# Patient Record
Sex: Female | Born: 1960 | Race: White | Hispanic: No | Marital: Married | Smoking: Never smoker
Health system: Southern US, Community
[De-identification: ages and names within clinical notes are randomized; demographics above are authoritative.]

## PROBLEM LIST (undated history)

## (undated) DIAGNOSIS — G629 Polyneuropathy, unspecified: Secondary | ICD-10-CM

## (undated) DIAGNOSIS — G500 Trigeminal neuralgia: Secondary | ICD-10-CM

## (undated) DIAGNOSIS — E785 Hyperlipidemia, unspecified: Secondary | ICD-10-CM

## (undated) HISTORY — PX: OTHER SURGICAL HISTORY: SHX169

## (undated) HISTORY — DX: Trigeminal neuralgia: G50.0

## (undated) HISTORY — PX: COLONOSCOPY: SHX174

## (undated) HISTORY — DX: Hyperlipidemia, unspecified: E78.5

## (undated) HISTORY — DX: Polyneuropathy, unspecified: G62.9

---

## 2007-05-27 ENCOUNTER — Ambulatory Visit: Payer: Self-pay | Admitting: Cardiology

## 2016-08-14 DIAGNOSIS — G500 Trigeminal neuralgia: Secondary | ICD-10-CM | POA: Insufficient documentation

## 2016-11-09 HISTORY — PX: OTHER SURGICAL HISTORY: SHX169

## 2018-08-08 ENCOUNTER — Other Ambulatory Visit (HOSPITAL_COMMUNITY): Payer: Self-pay | Admitting: Family Medicine

## 2018-08-08 ENCOUNTER — Ambulatory Visit (HOSPITAL_COMMUNITY)
Admission: RE | Admit: 2018-08-08 | Discharge: 2018-08-08 | Disposition: A | Discharge status: Home / Self Care | Payer: PRIVATE HEALTH INSURANCE | Source: Ambulatory Visit | Attending: Family Medicine | Admitting: Family Medicine

## 2018-08-08 DIAGNOSIS — M79605 Pain in left leg: Secondary | ICD-10-CM

## 2018-09-18 ENCOUNTER — Ambulatory Visit: Payer: PRIVATE HEALTH INSURANCE | Admitting: Orthopedic Surgery

## 2018-09-18 ENCOUNTER — Encounter: Payer: Self-pay | Admitting: Orthopedic Surgery

## 2018-09-18 VITALS — BP 110/72 | HR 73 | Ht 60.0 in | Wt 126.0 lb

## 2018-09-18 DIAGNOSIS — M1710 Unilateral primary osteoarthritis, unspecified knee: Secondary | ICD-10-CM

## 2018-09-18 DIAGNOSIS — M5432 Sciatica, left side: Secondary | ICD-10-CM | POA: Diagnosis not present

## 2018-09-18 NOTE — Progress Notes (Signed)
  NEW PATIENT OFFICE VISIT  Chief Complaint  Patient presents with  . Knee Pain    had left knee pain/ left leg pain but is resolved now     57 year old female referred [facility] by [facility].  The patient reports having had an episode of posterior knee pain with some mild vague symptoms of radiation into the hip complains of some intermittent mild back pain nothing too severe however in the course of her work-up her x-ray of the knee showed she had mild arthritis.  She was having some difficulty walking at the time she had the posterior knee pain and hence she was eventually referred here for further evaluation  She currently has no knee pain front or back of the left knee although she did fall and landed on her right knee has an abrasion with some pain over the abrasion of the right side  Her pain in the left knee was approximately 1-1/2 months ago on her about October 31.   Review of Systems  Constitutional: Negative for malaise/fatigue.  Musculoskeletal: Positive for back pain and joint pain.  Skin:       Abrasion right knee  Neurological: Negative for tingling, sensory change and speech change.     History reviewed. No pertinent past medical history.  History reviewed. No pertinent surgical history.  Family History  Problem Relation Age of Onset  . Heart disease Maternal Grandmother   . Heart disease Maternal Grandfather   . Heart disease Paternal Grandmother    Social History   Tobacco Use  . Smoking status: Never Smoker  . Smokeless tobacco: Never Used  Substance Use Topics  . Alcohol use: Not on file  . Drug use: Not on file    Not on File  Current Meds  Medication Sig  . gabapentin (NEURONTIN) 300 MG capsule Take by mouth.    BP 110/72   Pulse 73   Ht 5' (1.524 m)   Wt 126 lb (57.2 kg)   BMI 24.61 kg/m   Physical Exam  Constitutional: She is oriented to person, place, and time. She appears well-developed and well-nourished.   Neurological: She is alert and oriented to person, place, and time.  Psychiatric: She has a normal mood and affect. Judgment normal.  Vitals reviewed.   Ortho Exam  Right knee skin has an abrasion over the right anterior portion of the knee which is tender she has no effusion she has full range of motion there is no instability muscle tone is normal she is neurovascularly intact  On the left knee is no joint effusion there is some tenderness very mild over the medial joint line instability none detected muscle strength and tone normal no meniscal signs  Looks great neurovascular exam is normal  She does have some lower back tenderness especially L4-S1 and also left paracentral    MEDICAL DECISION SECTION  [facility] have a report that says very mild medial compartment degenerative changes questionable very tiny loose body cannot be excluded   Encounter Diagnoses  Name Primary?  . Sciatica of left side Yes  . Primary localized osteoarthritis of knee     PLAN: (Rx., injectx, surgery, frx, mri/ct) She is advised on the lifting mechanics told to follow-up with us if any of the symptoms and back her knee becomes significant  No orders of the defined types were placed in this encounter.   Fuller CanadaStanley Kameko Hukill, MD  09/18/2018 2:46 PM

## 2019-12-07 ENCOUNTER — Ambulatory Visit: Payer: PRIVATE HEALTH INSURANCE | Attending: Internal Medicine

## 2019-12-07 DIAGNOSIS — Z23 Encounter for immunization: Secondary | ICD-10-CM

## 2019-12-07 NOTE — Progress Notes (Signed)
   Covid-19 Vaccination Clinic  Name:  Britta Louth    MRN: [id number] DOB: [birth year]  12/07/2019  Ms. Lampson was observed post Covid-19 immunization for 15 minutes without incidence. She was provided with Vaccine Information Sheet and instruction to access the V-Safe system.   Ms. Parma was instructed to call 911 with any severe reactions post vaccine: Marland Kitchen Difficulty breathing  . Swelling of your face and throat  . A fast heartbeat  . A bad rash all over your body  . Dizziness and weakness    Immunizations Administered    Name Date Dose VIS Date Route   Moderna COVID-19 Vaccine 12/07/2019 12:00 PM 0.5 mL 09/09/2019 Intramuscular   Manufacturer: Moderna   Lot: [id number]   NDC: 33435-686-16

## 2020-01-10 ENCOUNTER — Ambulatory Visit: Payer: PRIVATE HEALTH INSURANCE | Attending: Internal Medicine

## 2020-01-10 DIAGNOSIS — Z23 Encounter for immunization: Secondary | ICD-10-CM

## 2020-01-10 NOTE — Progress Notes (Signed)
   Covid-19 Vaccination Clinic  Name:  Crystallynn Noorani    MRN: [id number] DOB: 28-Jul-1961  01/10/2020  Ms. Zawistowski was observed post Covid-19 immunization for 15 minutes without incident. She was provided with Vaccine Information Sheet and instruction to access the V-Safe system.   Ms. Bittman was instructed to call 911 with any severe reactions post vaccine: Marland Kitchen Difficulty breathing  . Swelling of face and throat  . A fast heartbeat  . A bad rash all over body  . Dizziness and weakness   Immunizations Administered    Name Date Dose VIS Date Route   Moderna COVID-19 Vaccine 01/10/2020 11:24 AM 0.5 mL 09/09/2019 Intramuscular   Manufacturer: Moderna   Lot: [id number]   NDC: 12244-975-30

## 2020-04-21 ENCOUNTER — Ambulatory Visit: Payer: Self-pay

## 2020-04-21 ENCOUNTER — Ambulatory Visit (INDEPENDENT_AMBULATORY_CARE_PROVIDER_SITE_OTHER): Payer: BC Managed Care – PPO | Admitting: Orthopaedic Surgery

## 2020-04-21 ENCOUNTER — Other Ambulatory Visit: Payer: Self-pay

## 2020-04-21 ENCOUNTER — Encounter: Payer: Self-pay | Admitting: Orthopaedic Surgery

## 2020-04-21 VITALS — Ht 60.0 in | Wt 123.0 lb

## 2020-04-21 DIAGNOSIS — G8929 Other chronic pain: Secondary | ICD-10-CM | POA: Diagnosis not present

## 2020-04-21 DIAGNOSIS — M25512 Pain in left shoulder: Secondary | ICD-10-CM | POA: Diagnosis not present

## 2020-04-21 NOTE — Progress Notes (Signed)
Office Visit Note   Patient: Cassie Brown           Date of Birth: July 18, 1961           MRN: [id number] Visit Date: 04/21/2020              Requested by: Sasser, Clarene Critchley, MD [address] [place],  [place] [address] PCP: Estanislado Pandy, MD   Assessment & Plan: Visit Diagnoses:  1. Chronic left shoulder pain     Plan: Monica right shoulder pain for the past 3 months without history of injury or trauma.  Had cortisone injection by Dr. Neita Carp with only 2 weeks relief.  Has positive impingement testing and positive Speed sign.  I am concerned she may have a rotator cuff tear or some pathology of the biceps tendon and will order an MRI scan  Follow-Up Instructions: Return After MRI scan left shoulder.   Orders:  Orders Placed This Encounter  Procedures  . XR Shoulder Left  . MR Shoulder Left w/o contrast   No orders of the defined types were placed in this encounter.     Procedures: No procedures performed   Clinical Data: No additional findings.   Subjective: Chief Complaint  Patient presents with  . Left Shoulder - Pain  Patient presents today for left shoulder pain. She has been hurting for 3 months. No known injury. She states that her pain is all throughout her shoulder and into her proximal arm. She has difficulty sleeping at night and getting comfortable. No numbness or tingling. Limited range of motion and cannot reach behind her. She does not take anything for pain. She saw her PCP Dr.Sasser about two months ago and received a cortisone injection, but states that it only helped for two weeks. No previous shoulder surgery. She is right hand dominant.   HPI  Review of Systems   Objective: Vital Signs: Ht 5' (1.524 m)   Wt 123 lb (55.8 kg)   BMI 24.02 kg/m   Physical Exam Constitutional:      Appearance: She is well-developed.  Eyes:     Pupils: Pupils are equal, round, and reactive to light.  Pulmonary:     Effort: Pulmonary effort is normal.  Skin:     General: Skin is warm and dry.  Neurological:     Mental Status: She is alert and oriented to person, place, and time.  Psychiatric:        Behavior: Behavior normal.     Ortho Exam left upper extremity nondominant.  Mildly positive impingement and empty can testing.  More prominent Speed sign.  Able to place left arm fully overhead and flexion with mild discomfort.  Has limited internal rotation related to pain.  There is some pain over the biceps tendon in the anterior subacromial region.  Skin intact.  Motor and sensory exam intact.  Good strength.  No crepitation no pain with range of motion of cervical spine  Specialty Comments:  No specialty comments available.  Imaging: XR Shoulder Left  Result Date: 04/21/2020 Films of the left shoulder were obtained in several projections.  The humeral head is centered at the glenoid.  Normal space between the humeral head and the acromion.  Type II acromium.  No ectopic calcification.  Mild degenerative changes at the acromioclavicular joint    PMFS History: Patient Active Problem List   Diagnosis Date Noted  . Pain in left shoulder 04/21/2020  . Trigeminal neuralgia of left side of face  08/14/2016   History reviewed. No pertinent past medical history.  Family History  Problem Relation Age of Onset  . Heart disease Maternal Grandmother   . Heart disease Maternal Grandfather   . Heart disease Paternal Grandmother     History reviewed. No pertinent surgical history. Social History   Occupational History  . Not on file  Tobacco Use  . Smoking status: Never Smoker  . Smokeless tobacco: Never Used  Substance and Sexual Activity  . Alcohol use: Not on file  . Drug use: Not on file  . Sexual activity: Not on file

## 2020-05-12 ENCOUNTER — Ambulatory Visit (HOSPITAL_COMMUNITY): Payer: BC Managed Care – PPO

## 2020-06-10 ENCOUNTER — Ambulatory Visit (HOSPITAL_COMMUNITY)
Admission: RE | Admit: 2020-06-10 | Discharge: 2020-06-10 | Disposition: A | Discharge status: Home / Self Care | Payer: BC Managed Care – PPO | Source: Ambulatory Visit | Attending: Orthopaedic Surgery | Admitting: Orthopaedic Surgery

## 2020-06-10 ENCOUNTER — Other Ambulatory Visit: Payer: Self-pay

## 2020-06-10 DIAGNOSIS — M25512 Pain in left shoulder: Secondary | ICD-10-CM | POA: Insufficient documentation

## 2020-06-10 DIAGNOSIS — G8929 Other chronic pain: Secondary | ICD-10-CM | POA: Insufficient documentation

## 2020-06-30 ENCOUNTER — Encounter: Payer: Self-pay | Admitting: Orthopedic Surgery

## 2020-06-30 ENCOUNTER — Ambulatory Visit (INDEPENDENT_AMBULATORY_CARE_PROVIDER_SITE_OTHER): Payer: BC Managed Care – PPO | Admitting: Orthopedic Surgery

## 2020-06-30 ENCOUNTER — Other Ambulatory Visit: Payer: Self-pay

## 2020-06-30 DIAGNOSIS — M7582 Other shoulder lesions, left shoulder: Secondary | ICD-10-CM | POA: Diagnosis not present

## 2020-06-30 DIAGNOSIS — M7552 Bursitis of left shoulder: Secondary | ICD-10-CM | POA: Insufficient documentation

## 2020-06-30 MED ORDER — LIDOCAINE HCL 2 % IJ SOLN
2.0000 mL | INTRAMUSCULAR | Status: AC | PRN
  Administered 2020-06-30: 2 mL

## 2020-06-30 MED ORDER — METHYLPREDNISOLONE ACETATE 40 MG/ML IJ SUSP
40.0000 mg | INTRAMUSCULAR | Status: AC | PRN
  Administered 2020-06-30: 40 mg via INTRA_ARTICULAR

## 2020-06-30 MED ORDER — BUPIVACAINE HCL 0.25 % IJ SOLN
0.6600 mL | INTRAMUSCULAR | Status: AC | PRN
  Administered 2020-06-30: .66 mL via INTRA_ARTICULAR

## 2020-06-30 NOTE — Progress Notes (Signed)
Office Visit Note   Patient: Cassie Brown           Date of Birth: [birth year]           MRN: [id number] Visit Date: 06/30/2020              Requested by: Sasser, Clarene Critchley, MD [address] [place],  [place] [address] PCP: Estanislado Pandy, MD   Assessment & Plan: Visit Diagnoses:  1. Bursitis of left shoulder   2. Rotator cuff tendonitis, left     Plan:  #1: At this time I have injected the subacromial space of the left shoulder.  She actually has had improvement quickly with empty can testing now. #2: If this does not continue to have a good effect on the bursitis and tendinitis then she can give [place] a call and we can possibly schedule her for a arthroscopic debridement with bursectomy and subacromial decompression.  Follow-Up Instructions: Return if symptoms worsen or fail to improve.   Orders:  No orders of the defined types were placed in this encounter.  No orders of the defined types were placed in this encounter.     Procedures: Large Joint Inj: L subacromial bursa on 06/30/2020 10:01 AM Indications: pain and diagnostic evaluation Details: 25 G 1.5 in needle, anterolateral approach  Arthrogram: No  Medications: 2 mL lidocaine 2 %; 0.66 mL bupivacaine 0.25 %; 40 mg methylPREDNISolone acetate 40 MG/ML Outcome: tolerated well, no immediate complications Procedure, treatment alternatives, risks and benefits explained, specific risks discussed. Consent was given by the patient. Immediately prior to procedure a time out was called to verify the correct patient, procedure, equipment, support staff and site/side marked as required. Patient was prepped and draped in the usual sterile fashion.       Clinical Data: No additional findings.   Subjective: Chief Complaint  Patient presents with  . Left Shoulder - Pain    HPI  Cassie Brown is seen today for follow-up of her left shoulder pain and discomfort.  She states that she still continues to have pain despite just local  conservative treatment.  She has not had a cortisone injection into the shoulder at any time.  She had an MRI scan performed and was seen today to review the MRI results and consider treatment plan.  Review of Systems  Constitutional: Negative.   HENT: Negative.   Respiratory: Negative.   Cardiovascular: Negative.   Gastrointestinal: Negative.   Genitourinary: Negative.   Skin: Negative.   Neurological: Negative.   Hematological: Negative.   Psychiatric/Behavioral: Negative.      Objective: Vital Signs: There were no vitals taken for this visit.  Physical Exam Constitutional:      Appearance: Normal appearance. She is well-developed and normal weight.  HENT:     Head: Normocephalic.  Eyes:     Pupils: Pupils are equal, round, and reactive to light.  Pulmonary:     Effort: Pulmonary effort is normal.  Skin:    General: Skin is warm and dry.  Neurological:     Mental Status: She is alert and oriented to person, place, and time.  Psychiatric:        Behavior: Behavior normal.     Ortho Exam  Exam today reveals mild positive impingement with empty can testing.  She is able to lift her arm fully overhead but does cause her some discomfort.  Internal rotation also makes it very uncomfortable.  Some pain over the biceps tendon anteriorly.  I do  not feel any crepitation.  She has good strength.  Specialty Comments:  No specialty comments available.  Imaging: MR Shoulder Left w/o contrast  Result Date: 06/10/2020 CLINICAL DATA:  Chronic left shoulder pain.  No injury. EXAM: MRI OF THE LEFT SHOULDER WITHOUT CONTRAST TECHNIQUE: Multiplanar, multisequence MR imaging of the shoulder was performed. No intravenous contrast was administered. COMPARISON:  None. FINDINGS: Rotator cuff: Moderate supraspinatus tendinosis with bursal surface fraying but no discrete tear. The infraspinatus, teres minor, and subscapularis tendons are unremarkable. Muscles: No atrophy or abnormal signal of the  muscles of the rotator cuff. Biceps long head:  Intact and normally positioned. Acromioclavicular Joint: Normal acromioclavicular joint. Type II acromion. Small to moderate amount of fluid in the subacromial/subdeltoid bursa extending into the subcoracoid bursa. Glenohumeral Joint: No joint effusion. No chondral defect. Labrum: Grossly intact, but evaluation is limited by lack of intraarticular fluid. Bones:  No marrow abnormality, fracture or dislocation. Other: None. IMPRESSION: 1. Moderate supraspinatus tendinosis with bursal surface fraying but no discrete tear. 2. Subacromial/subdeltoid and subcoracoid bursitis. Electronically Signed   By: Obie Dredge M.D.   On: 06/10/2020 11:10     PMFS History: Current Outpatient Medications  Medication Sig Dispense Refill  . gabapentin (NEURONTIN) 300 MG capsule Take by mouth.     No current facility-administered medications for this visit.    Patient Active Problem List   Diagnosis Date Noted  . Bursitis of left shoulder 06/30/2020  . Rotator cuff tendonitis, left 06/30/2020  . Pain in left shoulder 04/21/2020  . Trigeminal neuralgia of left side of face 08/14/2016   History reviewed. No pertinent past medical history.  Family History  Problem Relation Age of Onset  . Heart disease Maternal Grandmother   . Heart disease Maternal Grandfather   . Heart disease Paternal Grandmother     History reviewed. No pertinent surgical history. Social History   Occupational History  . Not on file  Tobacco Use  . Smoking status: Never Smoker  . Smokeless tobacco: Never Used  Substance and Sexual Activity  . Alcohol use: Not on file  . Drug use: Not on file  . Sexual activity: Not on file

## 2021-08-22 ENCOUNTER — Ambulatory Visit: Payer: BC Managed Care – PPO | Admitting: Neurology

## 2021-08-23 ENCOUNTER — Encounter: Payer: Self-pay | Admitting: *Deleted

## 2021-08-24 ENCOUNTER — Other Ambulatory Visit: Payer: Self-pay

## 2021-08-24 ENCOUNTER — Encounter: Payer: Self-pay | Admitting: Diagnostic Neuroimaging

## 2021-08-24 ENCOUNTER — Institutional Professional Consult (permissible substitution): Payer: BC Managed Care – PPO | Admitting: Diagnostic Neuroimaging

## 2021-08-24 ENCOUNTER — Ambulatory Visit: Payer: BC Managed Care – PPO | Admitting: Diagnostic Neuroimaging

## 2021-08-24 VITALS — BP 114/78 | HR 73 | Ht 60.0 in | Wt 130.4 lb

## 2021-08-24 DIAGNOSIS — R202 Paresthesia of skin: Secondary | ICD-10-CM | POA: Diagnosis not present

## 2021-08-24 DIAGNOSIS — G500 Trigeminal neuralgia: Secondary | ICD-10-CM | POA: Diagnosis not present

## 2021-08-24 NOTE — Progress Notes (Signed)
[facility]  PATIENT: Cassie Brown DOB: Feb 22, 1961  REFERRING CLINICIAN: Sasser, Clarene Critchley, MD HISTORY FROM: patient  REASON FOR VISIT: new consult    HISTORICAL  CHIEF COMPLAINT:  Chief Complaint  Patient presents with   Peripheral neuropathy    Rm 7 New Pt "neuropathy in hands and feet, will shoot up my arm, feels like electricity; on gabapentin x 3 years"     HISTORY OF PRESENT ILLNESS:   60 year old female here for evaluation of intermittent numbness and tingling.  Patient has history of left trigeminal neuralgia, previously tried carbamazepine (caused confusion and fogginess) and then gabapentin.  She also had gamma knife procedure in 2017 with good results.  In the last 1 year patient has been having some weight gain and therefore discussed with PCP to change gabapentin to alternate medication.  Patient was transitioned to topiramate.  Within 4 weeks she started to have pain in her right second toe, and then intermittent numbness and tingling in her hands and feet.  Due to these symptoms patient was transition back from topiramate to gabapentin.  She has been back on gabapentin for past 4 weeks.  Symptoms have significant proved but have not fully resolved.  She still has intermittent shooting pains and numbness and tingling in her hands and feet.  PCP lab testing for reversible treatable causes of neuropathy were negative.  Trigeminal neuralgia symptoms are stable.   REVIEW OF SYSTEMS: Full 14 system review of systems performed and negative with exception of: as per HPI.  ALLERGIES: No Known Allergies  HOME MEDICATIONS: Outpatient Medications Prior to Visit  Medication Sig Dispense Refill   Calcium Carbonate-Vit D-Min (CALCIUM 1200) 1200-1000 MG-UNIT CHEW Chew by mouth.     Cholecalciferol (D3-50 PO) Take by mouth daily.     Cyanocobalamin (B-12 PO) Take 5,000 mcg by mouth daily.     gabapentin (NEURONTIN) 300 MG capsule Take by mouth.      ibuprofen (ADVIL) 200 MG tablet Take 800 mg by mouth daily.     loratadine (CLARITIN) 10 MG tablet Take 10 mg by mouth daily.     Multiple Vitamins-Minerals (MULTIVITAMIN WITH MINERALS) tablet Take 1 tablet by mouth daily.     No facility-administered medications prior to visit.    PAST MEDICAL HISTORY: Past Medical History:  Diagnosis Date   Peripheral neuropathy    Trigeminal neuralgia     PAST SURGICAL HISTORY: Past Surgical History:  Procedure Laterality Date   CESAREAN SECTION     x3   gamma knife  11/2016   for trigeminal neuralgia   jaw reconstruction     ovarian cyst removed      FAMILY HISTORY: Family History  Problem Relation Age of Onset   Other Mother        trig neuralgia   Prostate cancer Father    Other Father        sepsis   Heart disease Maternal Grandmother    Heart disease Maternal Grandfather    Heart disease Paternal Grandmother     SOCIAL HISTORY: Social History   Socioeconomic History   Marital status: Married    Spouse name: Not on file   Number of children: 3   Years of education: Not on file   Highest education level: Professional school degree (e.g., MD, DDS, DVM, JD)  Occupational History    Comment: Chiropodist  Tobacco Use   Smoking status: Never   Smokeless tobacco: Never  Substance and Sexual Activity   Alcohol  use: Never   Drug use: Never   Sexual activity: Not on file  Other Topics Concern   Not on file  Social History Narrative   Lives with husband   Caffeine- coffee 2 c daily   Social Determinants of Health   Financial Resource Strain: Not on file  Food Insecurity: Not on file  Transportation Needs: Not on file  Physical Activity: Not on file  Stress: Not on file  Social Connections: Not on file  Intimate Partner Violence: Not on file     PHYSICAL EXAM  GENERAL EXAM/CONSTITUTIONAL: Vitals:  Vitals:   08/24/21 0823  BP: 114/78  Pulse: 73  Weight: 130 lb 6.4 oz (59.1 kg)  Height: 5'  (1.524 m)   Body mass index is 25.47 kg/m. Wt Readings from Last 3 Encounters:  08/24/21 130 lb 6.4 oz (59.1 kg)  04/21/20 123 lb (55.8 kg)  09/18/18 126 lb (57.2 kg)   Patient is in no distress; well developed, nourished and groomed; neck is supple  CARDIOVASCULAR: Examination of carotid arteries is normal; no carotid bruits Regular rate and rhythm, no murmurs Examination of peripheral vascular system by observation and palpation is normal  EYES: Ophthalmoscopic exam of optic discs and posterior segments is normal; no papilledema or hemorrhages No results found.  MUSCULOSKELETAL: Gait, strength, tone, movements noted in Neurologic exam below  NEUROLOGIC: MENTAL STATUS:  No flowsheet data found. awake, alert, oriented to person, place and time recent and remote memory intact normal attention and concentration language fluent, comprehension intact, naming intact fund of knowledge appropriate  CRANIAL NERVE:  2nd - no papilledema on fundoscopic exam 2nd, 3rd, 4th, 6th - pupils equal and reactive to light, visual fields full to confrontation, extraocular muscles intact, no nystagmus 5th - facial sensation symmetric 7th - facial strength symmetric 8th - hearing intact 9th - palate elevates symmetrically, uvula midline 11th - shoulder shrug symmetric 12th - tongue protrusion midline  MOTOR:  normal bulk and tone, full strength in the BUE, BLE  SENSORY:  normal and symmetric to light touch, pinprick, temperature, vibration  COORDINATION:  finger-nose-finger, fine finger movements normal  REFLEXES:  deep tendon reflexes 1+  and symmetric  GAIT/STATION:  narrow based gait     DIAGNOSTIC DATA (LABS, IMAGING, TESTING) - I reviewed patient records, labs, notes, testing and imaging myself where available.  No results found for: WBC, HGB, HCT, MCV, PLT No results found for: NA, K, CL, CO2, GLUCOSE, BUN, CREATININE, CALCIUM, PROT, ALBUMIN, AST, ALT, ALKPHOS, BILITOT,  GFRNONAA, GFRAA No results found for: CHOL, HDL, LDLCALC, LDLDIRECT, TRIG, CHOLHDL No results found for: XAJO8N No results found for: VITAMINB12 No results found for: TSH   09/28/16  MRI brain - The left vertebral artery indents the left aspect of the pons. Otherwise, normal pre- and postcontrast MRI of the brain.  PCP Labs - B12 429, TSH 2.23, RPR NR, glucose 94, BUN/Cr 14/0.95    ASSESSMENT AND PLAN  60 y.o. year old female here with:  Meds tried: carbamazepine, gabapentin, topiramate   Dx:  1. Intermittent paresthesia of hand and foot   2. Trigeminal neuralgia of left side of face      PLAN:  INTERMITTENT PAIN / PARESTHESIAS (started after trial of topiramate; now improving since stopping topiramate) - neuro exam unremarkable; labs unremarkable; unclear etiology; could represent benign paresthesias vs residual side effect from topiramate  - continue gabapentin; hopefully symptoms continue to improve over time  Return for return to PCP, pending if symptoms worsen  or fail to improve.    Suanne Marker, MD 08/24/2021, 9:20 AM Certified in Neurology, Neurophysiology and Neuroimaging  [facility] [address], [address] [place], [place] [address] [phone]

## 2021-08-24 NOTE — Patient Instructions (Signed)
  INTERMITTENT PAIN / PARESTHESIAS (improving) - neuro exam unremarkable; labs unremarkable; unclear etiology; could represent benign paresthesias; residual side effect from topiramate possible  - continue gabapentin

## 2021-10-24 ENCOUNTER — Ambulatory Visit: Payer: BC Managed Care – PPO | Admitting: Diagnostic Neuroimaging

## 2021-10-26 ENCOUNTER — Ambulatory Visit: Payer: BC Managed Care – PPO | Admitting: Diagnostic Neuroimaging

## 2022-03-09 IMAGING — MR MR SHOULDER*L* W/O CM
5 series · 38 of 40 positions shown · non-contrast
Comparison: None.

CLINICAL DATA: Chronic left shoulder pain.  No injury.

EXAM:
MRI OF THE LEFT SHOULDER WITHOUT CONTRAST
TECHNIQUE: Multiplanar, multisequence MR imaging of the shoulder was performed.
No intravenous contrast was administered.

[Series 6: T2 fat-sat · oblique · left · 4.0mm · 0.47mm/px · 8 of 26 slices shown (1 of 2)]
[im 1/26]
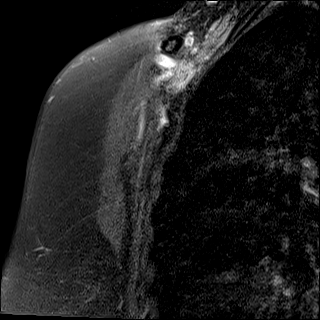
[im 4/26]
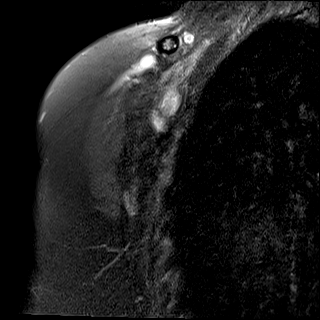
[im 8/26]
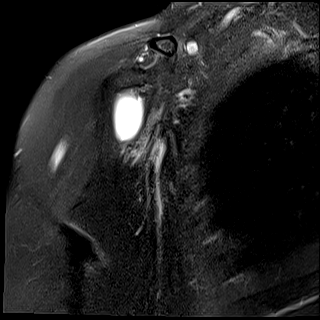
[im 11/26]
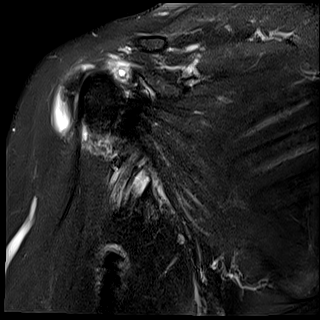
[im 15/26]
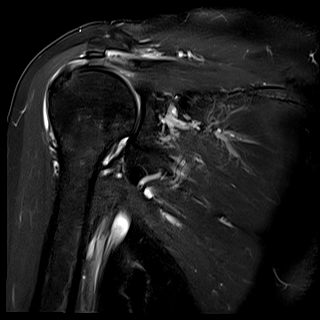
[im 18/26]
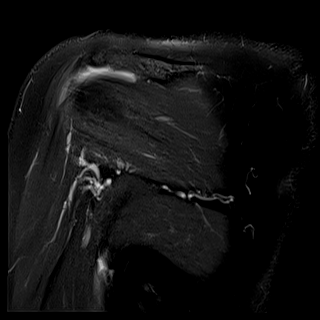
[im 22/26]
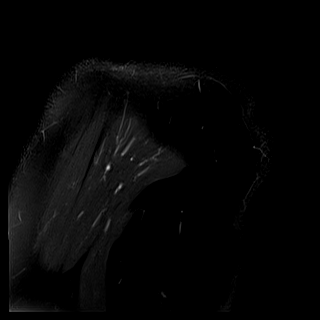
[im 26/26]
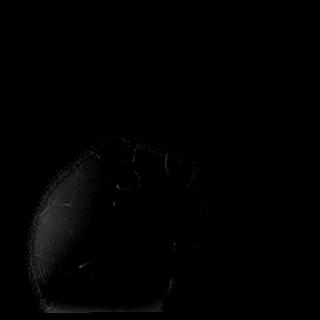

[Series 7: PD fat-sat · oblique · left · 4.0mm · 0.43mm/px · 8 of 26 slices shown (1 of 2)]
[im 1/26]
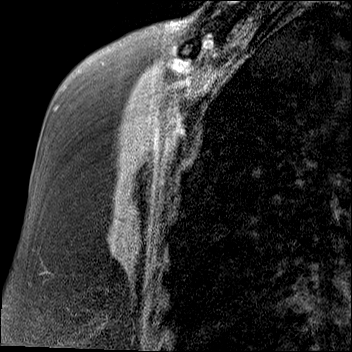
[im 4/26]
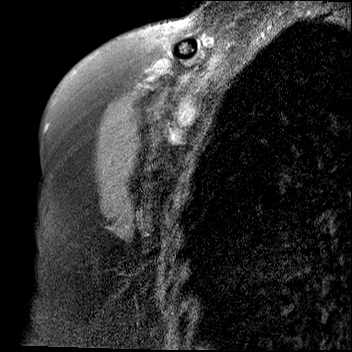
[im 8/26]
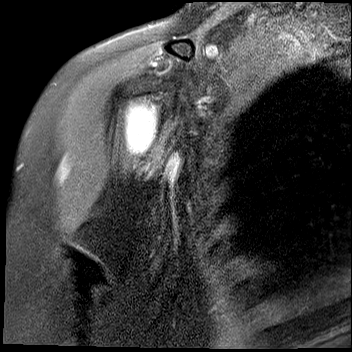
[im 11/26]
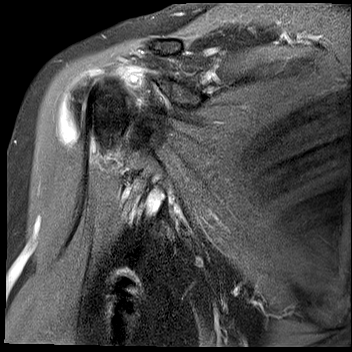
[im 15/26]
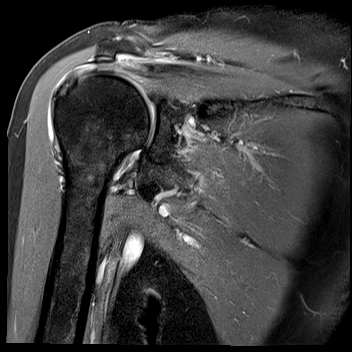
[im 18/26]
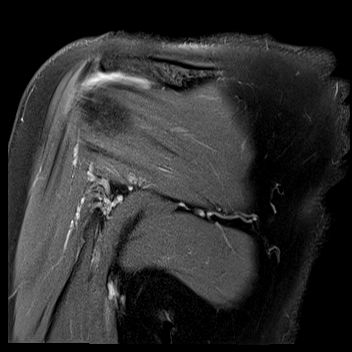
[im 22/26]
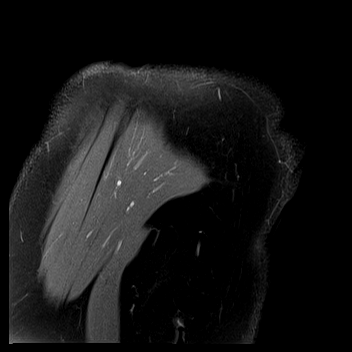
[im 26/26]
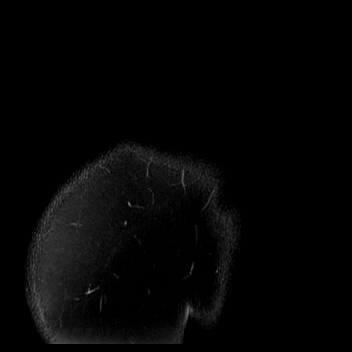

[Series 8: T2 fat-sat · oblique · left · 4.0mm · 0.47mm/px · 8 of 25 slices shown (2 of 2)]
[im 1/25]
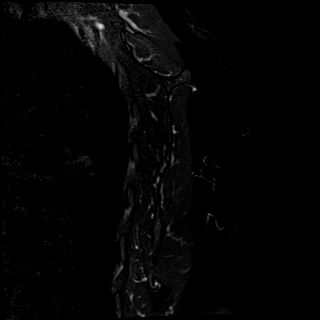
[im 4/25]
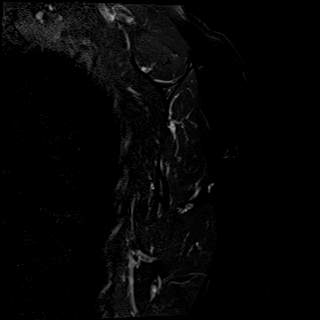
[im 7/25]
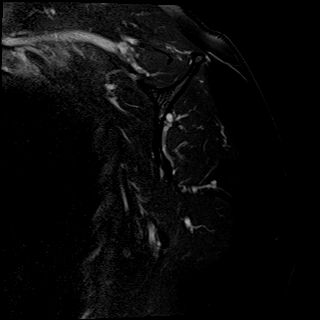
[im 11/25]
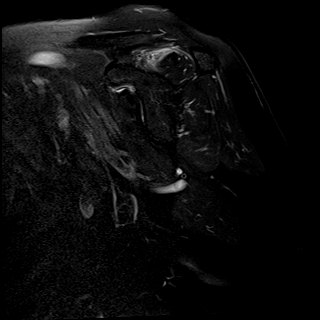
[im 14/25]
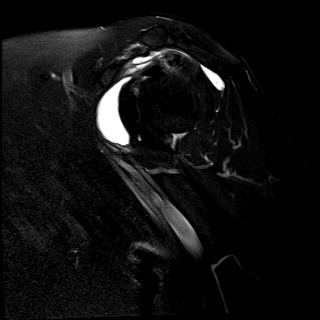
[im 18/25]
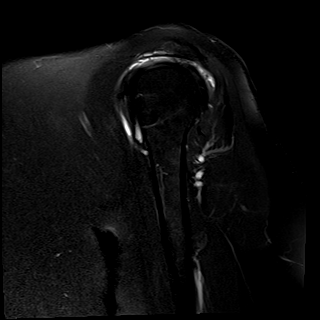
[im 21/25]
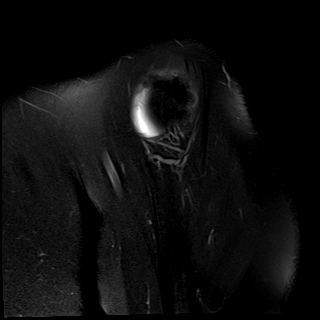
[im 25/25]
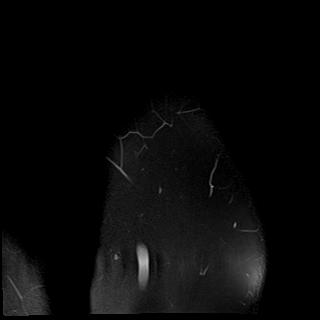

[Series 9: T1 · oblique · left · 4.0mm · 0.41mm/px · 6 of 25 slices shown]
[im 1/25]
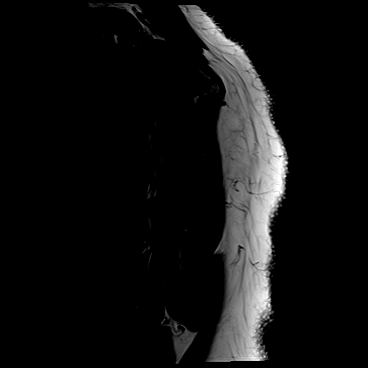
[im 4/25]
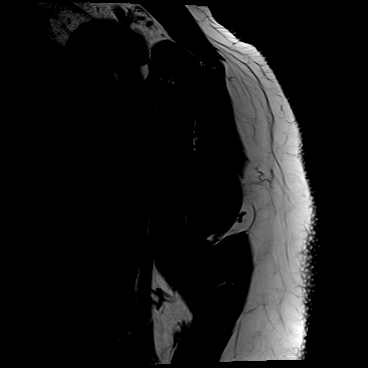
[im 7/25]
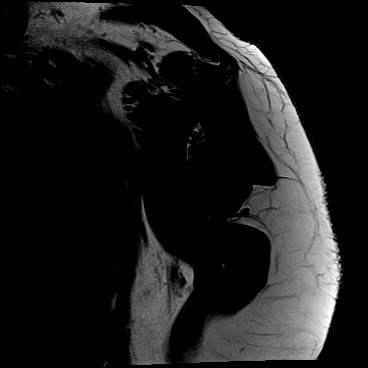
[im 11/25]
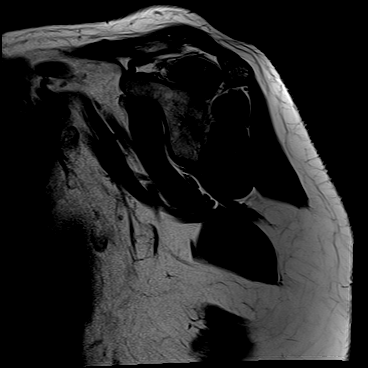
[im 14/25]
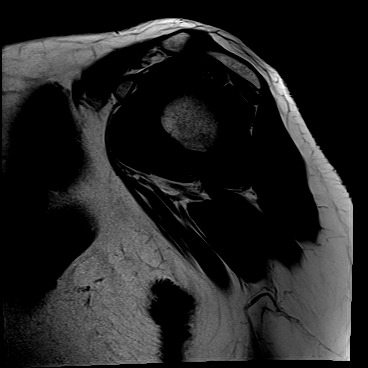
[im 18/25]
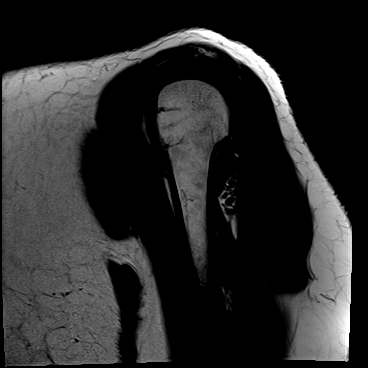

[Series 10: PD fat-sat · axial · left · 4.0mm · 0.35mm/px · z∈[-58,+57]mm · 8 of 25 slices shown (2 of 2)]
[im 1/25]
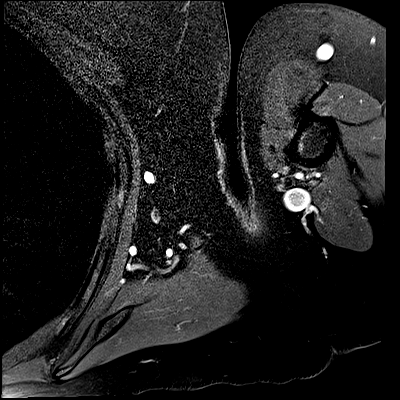
[im 4/25]
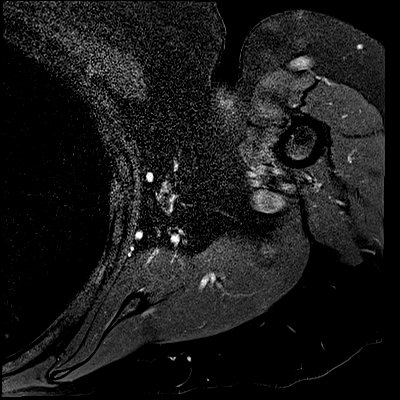
[im 7/25]
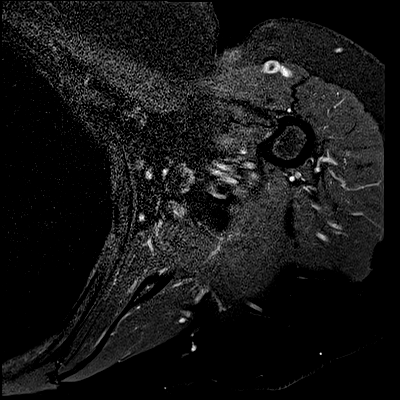
[im 11/25]
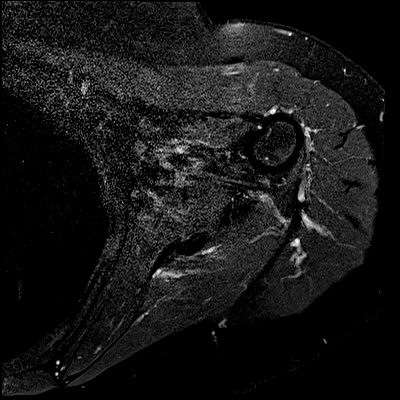
[im 14/25]
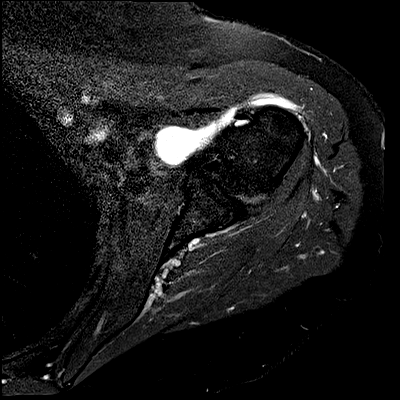
[im 18/25]
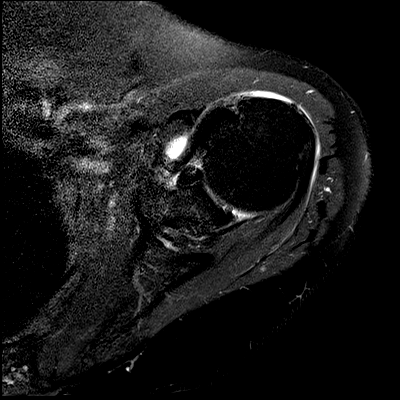
[im 21/25]
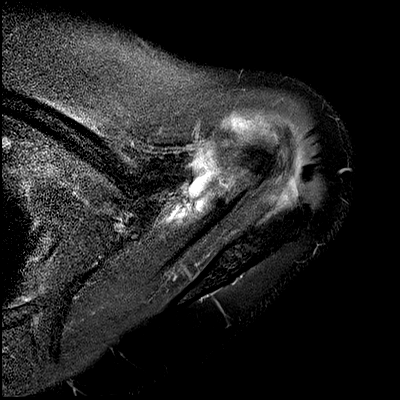
[im 25/25]
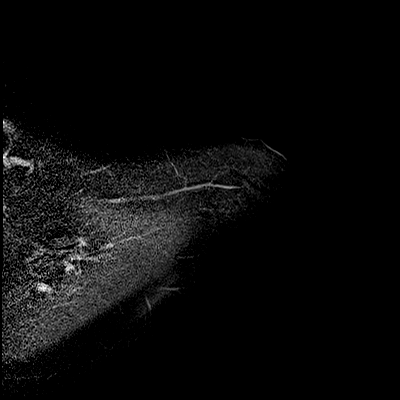

[38 of 40 positions shown; findings below may reference images not displayed]

FINDINGS: Rotator cuff: Moderate supraspinatus tendinosis with bursal surface
fraying but no discrete tear. The infraspinatus, teres minor, and
subscapularis tendons are unremarkable.

Muscles: No atrophy or abnormal signal of the muscles of the rotator
cuff.

Biceps long head:  Intact and normally positioned.

Acromioclavicular Joint: Normal acromioclavicular joint. Type II
acromion. Small to moderate amount of fluid in the
subacromial/subdeltoid bursa extending into the subcoracoid bursa.

Glenohumeral Joint: No joint effusion. No chondral defect.

Labrum: Grossly intact, but evaluation is limited by lack of
intraarticular fluid.

Bones:  No marrow abnormality, fracture or dislocation.

Other: None.
IMPRESSION: 1. Moderate supraspinatus tendinosis with bursal surface fraying but
no discrete tear.
2. Subacromial/subdeltoid and subcoracoid bursitis.

## 2022-03-15 ENCOUNTER — Encounter: Payer: Self-pay | Admitting: Dermatology

## 2022-03-15 ENCOUNTER — Ambulatory Visit: Payer: BC Managed Care – PPO | Admitting: Dermatology

## 2022-03-15 DIAGNOSIS — L738 Other specified follicular disorders: Secondary | ICD-10-CM | POA: Diagnosis not present

## 2022-03-15 DIAGNOSIS — Z1283 Encounter for screening for malignant neoplasm of skin: Secondary | ICD-10-CM

## 2022-03-15 DIAGNOSIS — L821 Other seborrheic keratosis: Secondary | ICD-10-CM | POA: Diagnosis not present

## 2022-03-15 DIAGNOSIS — L918 Other hypertrophic disorders of the skin: Secondary | ICD-10-CM | POA: Diagnosis not present

## 2022-03-15 DIAGNOSIS — B078 Other viral warts: Secondary | ICD-10-CM

## 2022-04-08 ENCOUNTER — Encounter: Payer: Self-pay | Admitting: Dermatology

## 2022-04-08 NOTE — Progress Notes (Signed)
   New Patient   Subjective  Cassie Brown is a 61 y.o. female who presents for the following: New Patient (Initial Visit) (Patient here today to have lesions under her bottom lip check x unsure per patient no bleeding no pain. Per patient she has a lesion on her left cheek x 1 months no pain no bleeding. No treatment on these lesions. ).  Check lesion on lower lip plus several other areas, general skin check Location:  Duration:  Quality:  Associated Signs/Symptoms: Modifying Factors:  Severity:  Timing: Context:    The following portions of the chart were reviewed this encounter and updated as appropriate:  Tobacco  Allergies  Meds  Problems  Med Hx  Surg Hx  Fam Hx      Objective  Well appearing patient in no apparent distress; mood and affect are within normal limits. Waist up skin examiantion: No atypical pigmented lesions (all checked with dermoscopy), no nonmelanoma skin cancer.  right neck 1 mm flesh-colored pedunculated papule  Right Anterior Neck 5 mm tan textured flattopped papule, typical dermoscopy.  Left Malar Cheek 2 mm flesh-colored papule with eccentric dell  Mid Lower Vermilion Lip 1 mm flesh-colored slightly verrucous papule compatible with verruca plana    A full examination was performed including scalp, head, eyes, ears, nose, lips, neck, chest, axillae, abdomen, back, buttocks, bilateral upper extremities, bilateral lower extremities, hands, feet, fingers, toes, fingernails, and toenails. All findings within normal limits unless otherwise noted below.  Areas beneath undergarments not fully examined   Assessment & Plan  Encounter for screening for malignant neoplasm of skin  Annual skin examination, encouraged to self examine twice annually.  Continued ultraviolet protection.  Skin tag right neck  Patient may choose future removal.  Seborrheic keratosis Right Anterior Neck  Leave if stable  Sebaceous hyperplasia of face Left Malar  Cheek  Told of similar appearance of early BCC so if there is growth or bleeding return for biopsy  Verruca plana Mid Lower Vermilion Lip  Discussed shave biopsy or freezing, intervention deferred

## 2024-08-12 ENCOUNTER — Ambulatory Visit: Payer: Self-pay

## 2024-08-12 ENCOUNTER — Telehealth: Payer: Self-pay

## 2024-08-12 VITALS — Ht 59.0 in | Wt 125.0 lb

## 2024-08-12 DIAGNOSIS — Z1211 Encounter for screening for malignant neoplasm of colon: Secondary | ICD-10-CM

## 2024-08-12 MED ORDER — NA SULFATE-K SULFATE-MG SULF 17.5-3.13-1.6 GM/177ML PO SOLN: 1.0000 | Freq: Once | ORAL | 0 refills | Status: AC

## 2024-08-12 NOTE — Telephone Encounter (Signed)
 Left patient message that she had an appointment today

## 2024-08-12 NOTE — Progress Notes (Signed)

## 2024-08-12 NOTE — Telephone Encounter (Signed)
 COMPLETED PRE VISIT

## 2024-08-20 ENCOUNTER — Encounter: Payer: Self-pay | Admitting: Gastroenterology

## 2024-08-26 ENCOUNTER — Encounter: Payer: Self-pay | Admitting: Gastroenterology

## 2024-08-26 ENCOUNTER — Ambulatory Visit: Admitting: Gastroenterology

## 2024-08-26 VITALS — BP 115/74 | HR 72 | Temp 97.8°F | Resp 16 | Ht 59.0 in | Wt 125.0 lb

## 2024-08-26 DIAGNOSIS — Z1211 Encounter for screening for malignant neoplasm of colon: Secondary | ICD-10-CM | POA: Diagnosis present

## 2024-08-26 DIAGNOSIS — K644 Residual hemorrhoidal skin tags: Secondary | ICD-10-CM | POA: Diagnosis not present

## 2024-08-26 DIAGNOSIS — K648 Other hemorrhoids: Secondary | ICD-10-CM | POA: Diagnosis not present

## 2024-08-26 MED ORDER — SODIUM CHLORIDE 0.9 % IV SOLN: 500.0000 mL | Freq: Once | INTRAVENOUS | Status: DC

## 2024-08-26 NOTE — Op Note (Addendum)
 [facility] Patient Name: Cassie Brown Procedure Date: 08/26/2024 1:33 PM MRN: [id number] Endoscopist: Gustav ALONSO Mcgee , MD, [id number] Age: 63 Referring MD:  Date of Birth: Mar 08, 1961 Gender: Female Account #: [id number] Procedure:                Colonoscopy Indications:              Screening for colorectal malignant neoplasm Medicines:                Monitored Anesthesia Care Procedure:                Pre-Anesthesia Assessment:                           - Prior to the procedure, a History and Physical                            was performed, and patient medications and                            allergies were reviewed. The patient's tolerance of                            previous anesthesia was also reviewed. The risks                            and benefits of the procedure and the sedation                            options and risks were discussed with the patient.                            All questions were answered, and informed consent                            was obtained. Prior Anticoagulants: The patient has                            taken no anticoagulant or antiplatelet agents. ASA                            Grade Assessment: II - A patient with mild systemic                            disease. After reviewing the risks and benefits,                            the patient was deemed in satisfactory condition to                            undergo the procedure.                           After obtaining informed consent, the colonoscope  was passed under direct vision. Throughout the                            procedure, the patient's blood pressure, pulse, and                            oxygen saturations were monitored continuously. The                            Olympus Scope SN [id number] was introduced through the                            anus and advanced to the the cecum, identified by                             appendiceal orifice and ileocecal valve. The                            colonoscopy was performed without difficulty. The                            patient tolerated the procedure well. The quality                            of the bowel preparation was adequate to identify                            polyps. The ileocecal valve, appendiceal orifice,                            and rectum were photographed. Scope In: 1:40:15 PM Scope Out: 1:58:44 PM Scope Withdrawal Time: 0 hours 12 minutes 12 seconds  Total Procedure Duration: 0 hours 18 minutes 29 seconds  Findings:                 The perianal and digital rectal examinations were                            normal.                           Non-bleeding external and internal hemorrhoids were                            found during retroflexion. The hemorrhoids were                            small.                           The exam was otherwise without abnormality. Complications:            No immediate complications. Estimated Blood Loss:     Estimated blood loss: none. Impression:               - Non-bleeding external and internal hemorrhoids.                           -  The examination was otherwise normal.                           - No specimens collected. Recommendation:           - Resume previous diet.                           - Continue present medications.                           - Repeat colonoscopy in 10 years for surveillance. Baltasar Twilley V. Jed Kutch, MD 08/26/2024 2:03:43 PM This report has been signed electronically.

## 2024-08-26 NOTE — Progress Notes (Unsigned)
 Vss nad trans to pacu

## 2024-08-26 NOTE — Patient Instructions (Signed)
 Discharge instructions given. Handout on Hemorrhoids. Resume previous medications. YOU HAD AN ENDOSCOPIC PROCEDURE TODAY AT THE [facility]:   Refer to the procedure report that was given to you for any specific questions about what was found during the examination.  If the procedure report does not answer your questions, please call your gastroenterologist to clarify.  If you requested that your care partner not be given the details of your procedure findings, then the procedure report has been included in a sealed envelope for you to review at your convenience later.  YOU SHOULD EXPECT: Some feelings of bloating in the abdomen. Passage of more gas than usual.  Walking can help get rid of the air that was put into your GI tract during the procedure and reduce the bloating. If you had a lower endoscopy (such as a colonoscopy or flexible sigmoidoscopy) you may notice spotting of blood in your stool or on the toilet paper. If you underwent a bowel prep for your procedure, you may not have a normal bowel movement for a few days.  Please Note:  You might notice some irritation and congestion in your nose or some drainage.  This is from the oxygen used during your procedure.  There is no need for concern and it should clear up in a day or so.  SYMPTOMS TO REPORT IMMEDIATELY:  Following lower endoscopy (colonoscopy or flexible sigmoidoscopy):  Excessive amounts of blood in the stool  Significant tenderness or worsening of abdominal pains  Swelling of the abdomen that is new, acute  Fever of 100F or higher    For urgent or emergent issues, a gastroenterologist can be reached at any hour by calling [phone]. Do not use MyChart messaging for urgent concerns.    DIET:  We do recommend a small meal at first, but then you may proceed to your regular diet.  Drink plenty of fluids but you should avoid alcoholic beverages for 24 hours.  ACTIVITY:  You should plan to take it easy for the  rest of today and you should NOT DRIVE or use heavy machinery until tomorrow (because of the sedation medicines used during the test).    FOLLOW UP: Our staff will call the number listed on your records the next business day following your procedure.  We will call around 7:15- 8:00 am to check on you and address any questions or concerns that you may have regarding the information given to you following your procedure. If we do not reach you, we will leave a message.     If any biopsies were taken you will be contacted by phone or by letter within the next 1-3 weeks.  Please call us at [phone] if you have not heard about the biopsies in 3 weeks.    SIGNATURES/CONFIDENTIALITY: You and/or your care partner have signed paperwork which will be entered into your electronic medical record.  These signatures attest to the fact that that the information above on your After Visit Summary has been reviewed and is understood.  Full responsibility of the confidentiality of this discharge information lies with you and/or your care-partner.

## 2024-08-26 NOTE — Progress Notes (Unsigned)
 Pt's states no medical or surgical changes since previsit or office visit.

## 2024-08-26 NOTE — Progress Notes (Unsigned)
 [facility] History and Physical   Primary Care Physician:  [facility]   Reason for Procedure:  Colorectal cancer screening  Plan:    Screening colonoscopy with possible interventions as needed     HPI: Cassie Brown is a very pleasant 63 y.o. female here for screening colonoscopy. Denies any nausea, vomiting, abdominal pain, melena or bright red blood per rectum  The risks and benefits as well as alternatives of endoscopic procedure(s) have been discussed and reviewed.  The patient was provided an opportunity to ask questions and all were answered. The patient agreed with the plan and demonstrated an understanding of the instructions.   Past Medical History:  Diagnosis Date   Hyperlipidemia    Peripheral neuropathy    Trigeminal neuralgia     Past Surgical History:  Procedure Laterality Date   CESAREAN SECTION     x3   COLONOSCOPY     gamma knife  11/2016   for trigeminal neuralgia   jaw reconstruction     ovarian cyst removed      Prior to Admission medications   Medication Sig Start Date End Date Taking? Authorizing Provider  Calcium Carbonate-Vit D-Min (CALCIUM 1200) 1200-1000 MG-UNIT CHEW Chew by mouth.   Yes [provider]  Cholecalciferol (D3-50 PO) Take by mouth daily.   Yes [provider]  Cyanocobalamin (B-12 PO) Take 5,000 mcg by mouth daily.   Yes [provider]  DTx App - Miscellaneous (OMADA GLP1 EXTENDED CARE TRACK) MISC by Does not apply route.   Yes [provider]  gabapentin (NEURONTIN) 300 MG capsule Take by mouth. 06/16/16  Yes [provider]  loratadine (CLARITIN) 10 MG tablet Take 10 mg by mouth daily.   Yes [provider]  Multiple Vitamins-Minerals (MULTIVITAMIN WITH MINERALS) tablet Take 1 tablet by mouth daily.   Yes [provider]  ibuprofen (ADVIL) 200 MG tablet Take 800 mg by mouth daily.    [provider]  WEGOVY 0.5 MG/0.5ML  SOAJ SMARTSIG:0.5 Milliliter(s) SUB-Q Once a Week Patient not taking: No sig reported 02/10/22   [provider]    Current Outpatient Medications  Medication Sig Dispense Refill   Calcium Carbonate-Vit D-Min (CALCIUM 1200) 1200-1000 MG-UNIT CHEW Chew by mouth.     Cholecalciferol (D3-50 PO) Take by mouth daily.     Cyanocobalamin (B-12 PO) Take 5,000 mcg by mouth daily.     DTx App - Miscellaneous (OMADA GLP1 EXTENDED CARE TRACK) MISC by Does not apply route.     gabapentin (NEURONTIN) 300 MG capsule Take by mouth.     loratadine (CLARITIN) 10 MG tablet Take 10 mg by mouth daily.     Multiple Vitamins-Minerals (MULTIVITAMIN WITH MINERALS) tablet Take 1 tablet by mouth daily.     ibuprofen (ADVIL) 200 MG tablet Take 800 mg by mouth daily.     WEGOVY 0.5 MG/0.5ML SOAJ SMARTSIG:0.5 Milliliter(s) SUB-Q Once a Week (Patient not taking: No sig reported)     Current Facility-Administered Medications  Medication Dose Route Frequency Provider Last Rate Last Admin   0.9 %  sodium chloride infusion  500 mL Intravenous Once Chanel Mcadams V, MD        Allergies as of 08/26/2024   (No Known Allergies)    Family History  Problem Relation Age of Onset   Other Mother        trig neuralgia   Prostate cancer Father    Other Father  sepsis   Heart disease Maternal Grandmother    Heart disease Maternal Grandfather    Heart disease Paternal Grandmother    Colon cancer Neg Hx    Colon polyps Neg Hx    Esophageal cancer Neg Hx    Rectal cancer Neg Hx    Stomach cancer Neg Hx     Social History   Socioeconomic History   Marital status: Married    Spouse name: Not on file   Number of children: 3   Years of education: Not on file   Highest education level: Professional school degree (e.g., MD, DDS, DVM, JD)  Occupational History    Comment: chiropodist  Tobacco Use   Smoking status: Never   Smokeless tobacco: Never  Vaping Use   Vaping status: Never Used   Substance and Sexual Activity   Alcohol use: Never   Drug use: Never   Sexual activity: Not on file  Other Topics Concern   Not on file  Social History Narrative   Lives with husband   Caffeine- coffee 2 c daily   Social Drivers of Corporate Investment Banker Strain: Not on file  Food Insecurity: Not on file  Transportation Needs: Not on file  Physical Activity: Not on file  Stress: Not on file  Social Connections: Unknown (02/21/2022)   Received from [facility]   Social Network    Social Network: Not on file  Intimate Partner Violence: Unknown (01/13/2022)   Received from [facility]   HITS    Physically Hurt: Not on file    Insult or Talk Down To: Not on file    Threaten Physical Harm: Not on file    Scream or Curse: Not on file    Review of Systems:  All other review of systems negative except as mentioned in the HPI.  Physical Exam: Vital signs in last 24 hours: BP 118/67   Pulse 80   Temp 97.8 F (36.6 C) (Temporal)   Ht 4' 11 (1.499 m)   Wt 125 lb (56.7 kg)   SpO2 97%   BMI 25.25 kg/m  General:   Alert, NAD Lungs:  Clear .   Heart:  Regular rate and rhythm Abdomen:  Soft, nontender and nondistended. Neuro/Psych:  Alert and cooperative. Normal mood and affect. A and O x 3  Reviewed labs, radiology imaging, old records and pertinent past GI work up  Patient is appropriate for planned procedure(s) and anesthesia in an ambulatory setting   K. Veena Roseline Ebarb , MD [phone]

## 2024-08-27 ENCOUNTER — Telehealth: Payer: Self-pay

## 2024-08-27 NOTE — Telephone Encounter (Signed)
 No answer. Left message to call if having any issues or concerns, B.Tyrell Seifer RN.
